# Patient Record
Sex: Male | Born: 2000 | Race: Black or African American | Hispanic: No | Marital: Single | Smoking: Never smoker
Health system: Southern US, Community
[De-identification: ages and names within clinical notes are randomized; demographics above are authoritative.]

## PROBLEM LIST (undated history)

## (undated) HISTORY — PX: NO PAST SURGERIES: SHX2092

---
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
## 2020-07-02 IMAGING — CR DG KNEE COMPLETE 4+V*R*
4 series · 4 of 4 positions shown · non-contrast
Comparison: None.

CLINICAL DATA: Patient with twisting injury to the right knee.

EXAM:
RIGHT KNEE - COMPLETE 4+ VIEW

[knee ap]
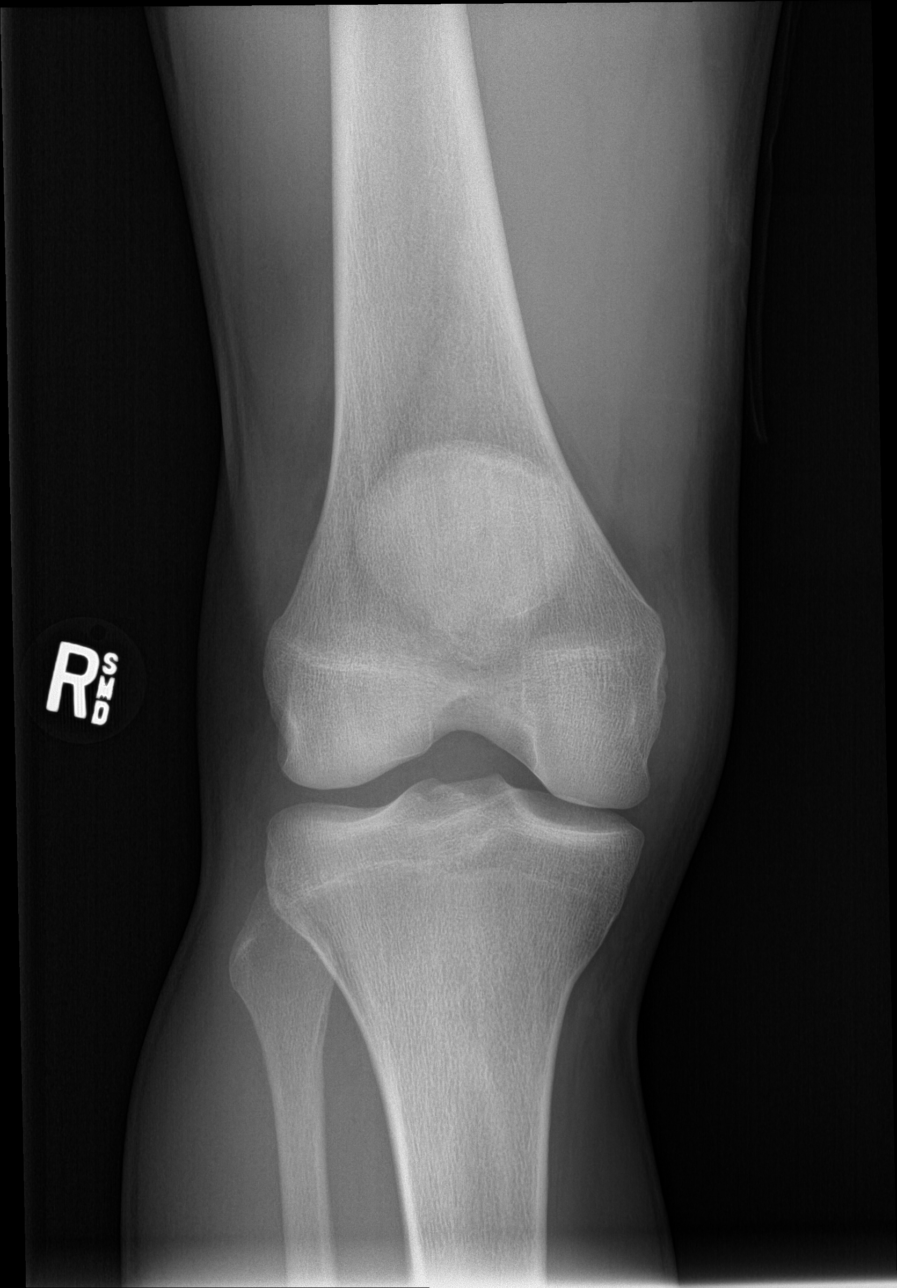

[knee lat]
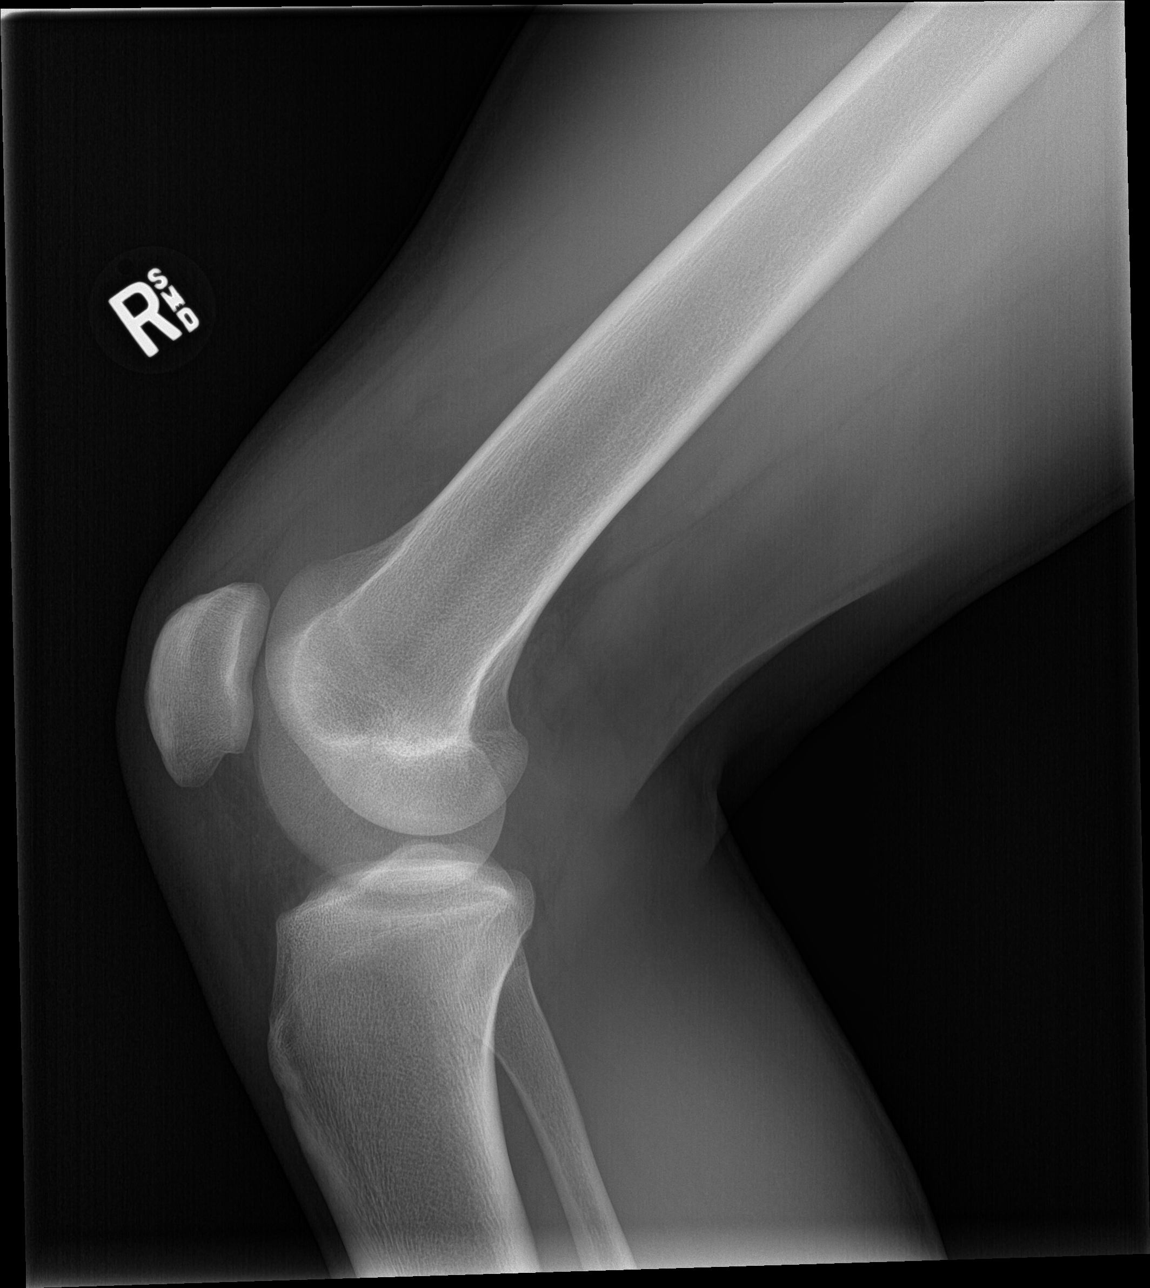

[tunnel]
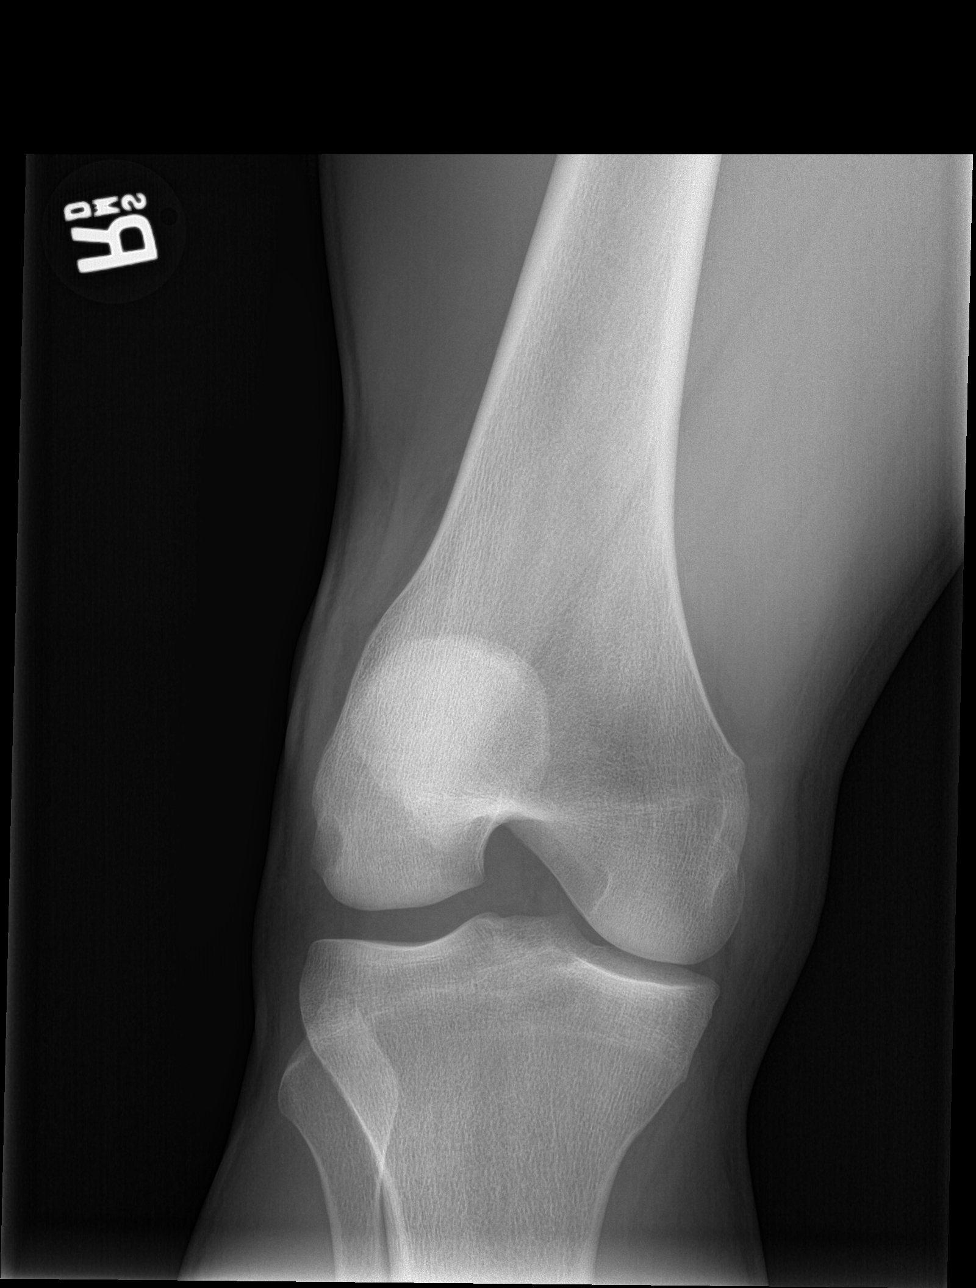

[patella skyline]
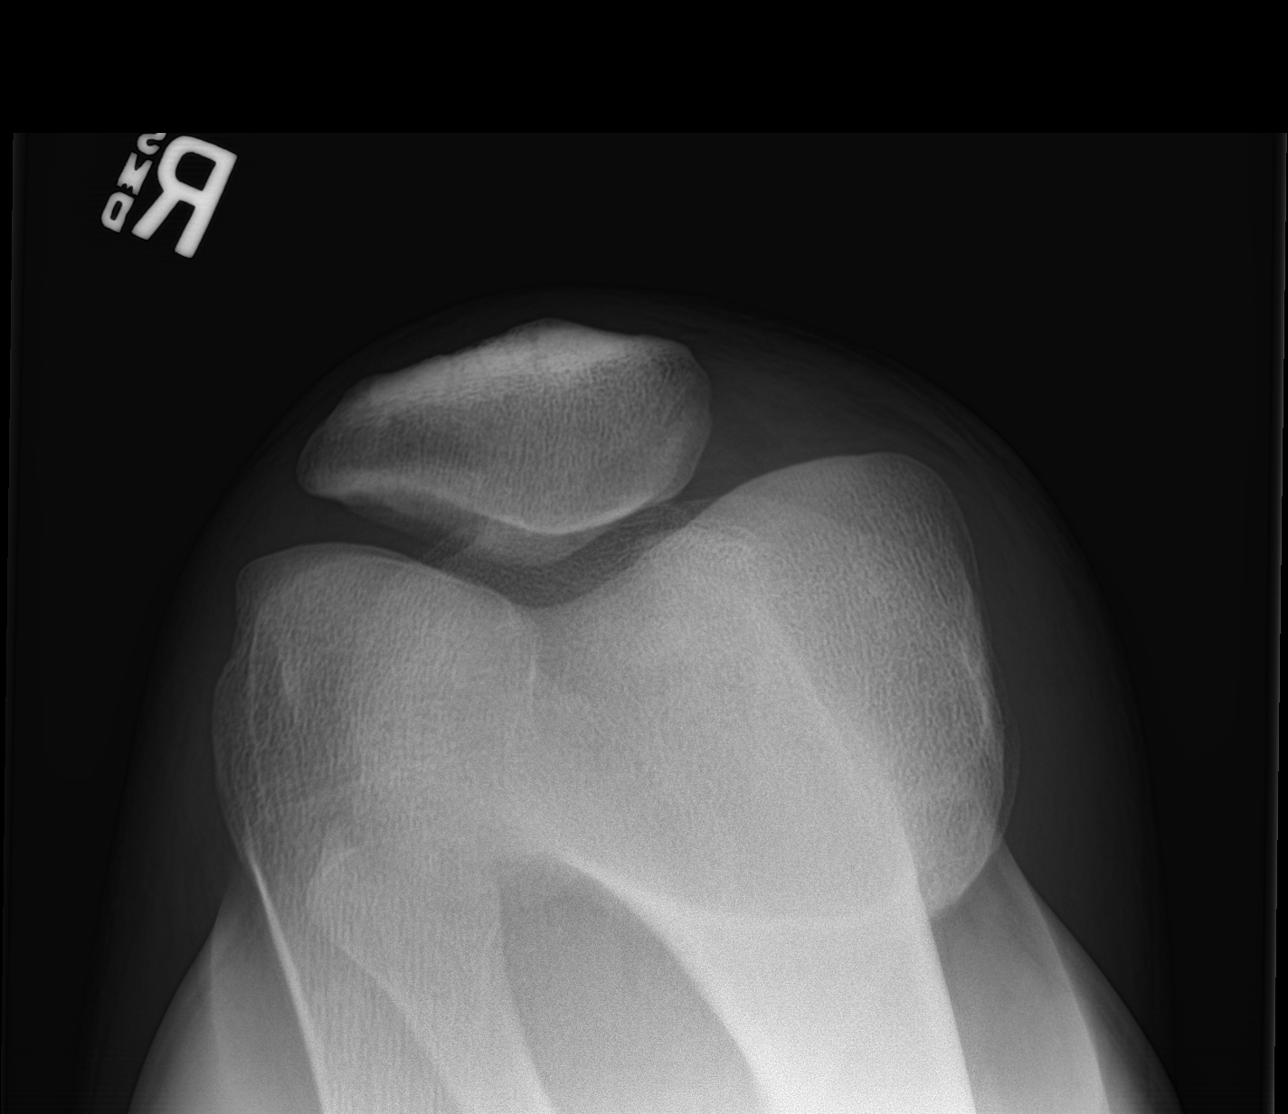

[4 of 4 positions shown; findings below may reference images not displayed]

FINDINGS: Normal anatomic alignment. No evidence for acute fracture or
dislocation. Regional soft tissues are unremarkable.
IMPRESSION: No evidence for acute fracture or dislocation.
